# Patient Record
Sex: Male | Born: 1990 | Race: Black or African American | Hispanic: No | Marital: Single | Smoking: Current every day smoker
Health system: Southern US, Community
[De-identification: ages and names within clinical notes are randomized; demographics above are authoritative.]

## PROBLEM LIST (undated history)

## (undated) DIAGNOSIS — R079 Chest pain, unspecified: Secondary | ICD-10-CM

## (undated) DIAGNOSIS — R012 Other cardiac sounds: Secondary | ICD-10-CM

## (undated) DIAGNOSIS — Z720 Tobacco use: Secondary | ICD-10-CM

## (undated) HISTORY — DX: Chest pain, unspecified: R07.9

## (undated) HISTORY — DX: Tobacco use: Z72.0

## (undated) HISTORY — DX: Other cardiac sounds: R01.2

---

## 2012-12-30 ENCOUNTER — Encounter (HOSPITAL_COMMUNITY): Payer: Self-pay | Admitting: *Deleted

## 2012-12-30 ENCOUNTER — Emergency Department (HOSPITAL_COMMUNITY)
Admission: EM | Admit: 2012-12-30 | Discharge: 2012-12-30 | Disposition: A | Discharge status: Home / Self Care | Payer: PRIVATE HEALTH INSURANCE | Attending: Emergency Medicine | Admitting: Emergency Medicine

## 2012-12-30 DIAGNOSIS — IMO0002 Reserved for concepts with insufficient information to code with codable children: Secondary | ICD-10-CM

## 2012-12-30 DIAGNOSIS — Y9372 Activity, wrestling: Secondary | ICD-10-CM | POA: Insufficient documentation

## 2012-12-30 DIAGNOSIS — Z79899 Other long term (current) drug therapy: Secondary | ICD-10-CM | POA: Insufficient documentation

## 2012-12-30 DIAGNOSIS — W269XXA Contact with unspecified sharp object(s), initial encounter: Secondary | ICD-10-CM | POA: Insufficient documentation

## 2012-12-30 DIAGNOSIS — S61209A Unspecified open wound of unspecified finger without damage to nail, initial encounter: Secondary | ICD-10-CM | POA: Insufficient documentation

## 2012-12-30 DIAGNOSIS — F17200 Nicotine dependence, unspecified, uncomplicated: Secondary | ICD-10-CM | POA: Insufficient documentation

## 2012-12-30 DIAGNOSIS — Y929 Unspecified place or not applicable: Secondary | ICD-10-CM | POA: Insufficient documentation

## 2012-12-30 MED ORDER — CEPHALEXIN 250 MG PO CAPS
500.0000 mg | ORAL_CAPSULE | Freq: Once | ORAL | Status: AC
  Administered 2012-12-30: 500 mg via ORAL
  Filled 2012-12-30: qty 2

## 2012-12-30 MED ORDER — CEPHALEXIN 500 MG PO CAPS: 500.0000 mg | ORAL_CAPSULE | Freq: Four times a day (QID) | ORAL | Status: DC

## 2012-12-30 NOTE — ED Notes (Signed)
Pt c/o pain to left ring finger that radiates down elbow. Pt reports he was wrestling with his friend last night when he cut his finger, pt sts he is unsure what actually cut it. No bleeding noted. Pt in nad. Pt has full movement of all digits.

## 2012-12-30 NOTE — Discharge Instructions (Signed)
Is very important that you take your antibiotics as directed and return to the emergency room in 2 days for a recheck. Return to the emergency room immediately if you see signs of infection( warmth, redness, tenderness, pus, sharp increase in pain, fever) .   Keep wound dry and do not remove dressing for 24 hours, after that wash gently morning and night with soap and water. Do NOT use rubbing alcohol or hydrogen peroxide, you may use a topical antibiotic ointment and cover with a bandaid.

## 2012-12-30 NOTE — ED Provider Notes (Signed)
History  This chart was scribed for non-physician practitioner, Wynetta Emery, PA-C working with Loren Racer, MD by Shari Heritage, ED Scribe. This patient was seen in room TR07C/TR07C and the patient's care was started at 1458.   CSN: [id number]  Arrival date & time 12/30/12  1420   First MD Initiated Contact with Patient 12/30/12 1458      Chief Complaint  Patient presents with  . Laceration    The history is provided by the patient. No language interpreter was used.    HPI Comments: Nathan Petersen is a 22 y.o. male who presents to the Emergency Department complaining of a laceration to the 4th left finger and mild to moderate, constant, dull pain to the area onset 17.5 hours ago. Patient states that he was wrestling with one of his friends when the incident occurred. He states that he is unsure what caused the laceration or through what mechanism. He states Tdap is UTD, last shot was in 2010. He reports no other significant past medical or surgical history. He is current every day smoker.    History reviewed. No pertinent past medical history.  History reviewed. No pertinent past surgical history.  History reviewed. No pertinent family history.  History  Substance Use Topics  . Smoking status: Current Every Day Smoker    Types: Cigarettes  . Smokeless tobacco: Not on file  . Alcohol Use: Yes     Comment: occ      Review of Systems  Constitutional: Negative for fever.  Respiratory: Negative for shortness of breath.   Cardiovascular: Negative for chest pain.  Gastrointestinal: Negative for nausea, vomiting, abdominal pain and diarrhea.  Skin: Positive for wound.  All other systems reviewed and are negative.    Allergies  Review of patient's allergies indicates no known allergies.  Home Medications   Current Outpatient Rx  Name  Route  Sig  Dispense  Refill  . naproxen sodium (ANAPROX) 220 MG tablet   Oral   Take 440 mg by mouth once.           Triage  Vitals: BP 115/74  Pulse 99  Temp(Src) 98.5 F (36.9 C) (Oral)  Resp 16  SpO2 97%  Physical Exam  Nursing note and vitals reviewed. Constitutional: He is oriented to person, place, and time. He appears well-developed and well-nourished. No distress.  HENT:  Head: Normocephalic.  Eyes: Conjunctivae and EOM are normal.  Cardiovascular: Normal rate.   Pulmonary/Chest: Effort normal. No stridor.  Musculoskeletal: Normal range of motion.  Neurological: He is alert and oriented to person, place, and time.  Skin: Laceration noted.  Full thickness, gaping laceration to the left 4th digit distal phalanx on the lateral side. Patient has full range of motion in both flexion and extension of the PIP and the PIP in isolation.  Psychiatric: He has a normal mood and affect.    ED Course  Procedures (including critical care time) DIAGNOSTIC STUDIES: Oxygen Saturation is 97% on room air, adequate by my interpretation.    COORDINATION OF CARE: 3:31 PM- Patient informed of current plan for treatment and evaluation and agrees with plan at this time.   4:47PM- Placed sutures to left 4th digit. Patient's lac occurred more than 17 hours ago so will perform loosely approximated closure.  LACERATION REPAIR Performed by: Wynetta Emery, PA-C Consent: Verbal consent obtained. Risks and benefits: risks, benefits and alternatives were discussed Patient identity confirmed: provided demographic data Time out performed prior to procedure Prepped and Draped in normal  sterile fashion Wound explored explored to depth in good light on a bloodless field there is no bony exposure or tendon exposure. Laceration Location: left 4th digit Laceration Length: 2.5 cm No Foreign Bodies seen or palpated Anesthesia: digital block Local anesthetic: lidocaine 2% wo epinephrine Anesthetic total: 3 ml Irrigation method: syringe Amount of cleaning: standard Skin closure: LOOSELY APPROXIMATED Number of sutures or  staples: 2  Technique: Simple interrupted  Patient tolerance: Patient tolerated the procedure well with no immediate complications.   Labs Reviewed - No data to display No results found.   1. Laceration       MDM   Wound was left open to close by secondary intention as he has waited too long to present to the emergency room. I have profusely irrigated and cleaned the wound including irrigation for 5 minutes and soaking in povidone, hydroperoxide and saline bath for 5 minutes. Will advise him to return to the emergency room for recheck in 48 hours.  Filed Vitals:   12/30/12 1436  BP: 115/74  Pulse: 99  Temp: 98.5 F (36.9 C)  TempSrc: Oral  Resp: 16  SpO2: 97%     Pt verbalized understanding and agrees with care plan. Outpatient follow-up and return precautions given.    Discharge Medication List as of 12/30/2012  5:28 PM    START taking these medications   Details  cephALEXin (KEFLEX) 500 MG capsule Take 1 capsule (500 mg total) by mouth 4 (four) times daily., Starting 12/30/2012, Until Discontinued, Print        I personally performed the services described in this documentation, which was scribed in my presence. The recorded information has been reviewed and is accurate.   Wynetta Emery, PA-C 12/31/12 1724

## 2012-12-30 NOTE — ED Notes (Signed)
Pt reports getting laceration to left ring finger, happened last night, does not remembered what happened, no bleeding noted at triage. Last tetanus 2010.

## 2013-01-01 NOTE — ED Provider Notes (Signed)
Medical screening examination/treatment/procedure(s) were performed by non-physician practitioner and as supervising physician I was immediately available for consultation/collaboration.  Darrall Strey, MD 01/01/13 1534 

## 2013-01-02 ENCOUNTER — Emergency Department (HOSPITAL_COMMUNITY)
Admission: EM | Admit: 2013-01-02 | Discharge: 2013-01-02 | Disposition: A | Discharge status: Home / Self Care | Payer: PRIVATE HEALTH INSURANCE | Attending: Emergency Medicine | Admitting: Emergency Medicine

## 2013-01-02 ENCOUNTER — Encounter (HOSPITAL_COMMUNITY): Payer: Self-pay | Admitting: *Deleted

## 2013-01-02 DIAGNOSIS — F17200 Nicotine dependence, unspecified, uncomplicated: Secondary | ICD-10-CM | POA: Insufficient documentation

## 2013-01-02 DIAGNOSIS — IMO0002 Reserved for concepts with insufficient information to code with codable children: Secondary | ICD-10-CM

## 2013-01-02 DIAGNOSIS — Z4802 Encounter for removal of sutures: Secondary | ICD-10-CM | POA: Insufficient documentation

## 2013-01-02 NOTE — ED Provider Notes (Signed)
History  This chart was scribed for Nathan Petersen, non-physician practitioner working with Nathan Petersen,  by [name], ED Scribe. This patient was seen in room TR09C/TR09C and the patient's care was started at 4:58 PM.  CSN: [id number]  Arrival date & time 01/02/13  1636   First MD Initiated Contact with Patient 01/02/13 1658      Chief Complaint  Patient presents with  . Suture / Staple Removal     Patient is a 22 y.o. male presenting with wound check. The history is provided by the patient. No language interpreter was used.  Wound Check This is a new problem. The current episode started 2 days ago. The problem occurs constantly. The problem has been gradually improving. Exacerbated by: use of the left fourth finger. Nothing relieves the symptoms.    Nathan Petersen is a 22 y.o. male who presents to the Emergency Department for a wound recheck. He was seen in the ED 2 days ago and had sutures placed to the left fourth finger. He reports that the injury occurred the day before but he was "scared to come in". He states that he has had the wound wrapped with sterile bandages and has been washing the area with soap and water for the past 2 days. He was prescribed keflex and states that he has been taking the antibiotic as prescribed. He denies any serosanguinous drainage or increased pain to the area. He does not have a h/o chronic medical conditions and is a current everyday smoker and occasional alcohol user.   History reviewed. No pertinent past medical history.  History reviewed. No pertinent past surgical history.  No family history on file.  History  Substance Use Topics  . Smoking status: Current Every Day Smoker    Types: Cigarettes  . Smokeless tobacco: Not on file  . Alcohol Use: Yes     Comment: occ      Review of Systems  Constitutional: Negative for fever and chills.  Gastrointestinal: Negative for nausea and vomiting.  Skin: Positive for wound (to  the left fourth finger).  All other systems reviewed and are negative.    Allergies  Review of patient's allergies indicates no known allergies.  Home Medications   Current Outpatient Rx  Name  Route  Sig  Dispense  Refill  . cephALEXin (KEFLEX) 500 MG capsule   Oral   Take 1 capsule (500 mg total) by mouth 4 (four) times daily.   20 capsule   0   . naproxen sodium (ANAPROX) 220 MG tablet   Oral   Take 440 mg by mouth once.           Triage Vitals: BP 109/89  Pulse 50  Temp(Src) 98.2 F (36.8 C) (Oral)  SpO2 99%  Physical Exam  Nursing note and vitals reviewed. Constitutional: He is oriented to person, place, and time. He appears well-developed and well-nourished. No distress.  HENT:  Head: Normocephalic and atraumatic.  Eyes: EOM are normal.  Neck: Neck supple. No tracheal deviation present.  Cardiovascular: Normal rate.   Pulmonary/Chest: Effort normal. No respiratory distress.  Musculoskeletal: Normal range of motion.  Neurological: He is alert and oriented to person, place, and time.  Skin: Skin is warm and dry.  Well-healing delayed closure sutured laceration to the left fourth finger, no surrounding erythema or drainage  Psychiatric: He has a normal mood and affect. His behavior is normal.    ED Course  Procedures (including critical care time)  DIAGNOSTIC STUDIES:  Oxygen Saturation is 99% on room air, normal by my interpretation.    COORDINATION OF CARE: 5:13 PM- Discussed discharge plan which includes continued cleaning of the wound and returning for suture removal in 5-7 days with pt and pt agreed to plan.    Labs Reviewed - No data to display No results found.   No diagnosis found.    MDM     I personally performed the services described in this documentation, which was scribed in my presence. The recorded information has been reviewed and is accurate.      [name], NP 01/02/13 [phone]

## 2013-01-02 NOTE — ED Notes (Signed)
Pt here for suture removal.  Denies s/s of infection.

## 2013-01-03 NOTE — ED Provider Notes (Signed)
Medical screening examination/treatment/procedure(s) were performed by non-physician practitioner and as supervising physician I was immediately available for consultation/collaboration.  Balin Vandegrift J. Tkeya Stencil, MD 01/03/13 0006 

## 2016-01-17 ENCOUNTER — Encounter (HOSPITAL_COMMUNITY): Payer: Self-pay | Admitting: Emergency Medicine

## 2016-01-17 ENCOUNTER — Emergency Department (HOSPITAL_COMMUNITY)
Admission: EM | Admit: 2016-01-17 | Discharge: 2016-01-17 | Disposition: A | Discharge status: Home / Self Care | Payer: Self-pay | Attending: Emergency Medicine | Admitting: Emergency Medicine

## 2016-01-17 ENCOUNTER — Emergency Department (HOSPITAL_COMMUNITY): Payer: Self-pay

## 2016-01-17 DIAGNOSIS — R0789 Other chest pain: Secondary | ICD-10-CM

## 2016-01-17 DIAGNOSIS — R05 Cough: Secondary | ICD-10-CM | POA: Insufficient documentation

## 2016-01-17 DIAGNOSIS — Z792 Long term (current) use of antibiotics: Secondary | ICD-10-CM | POA: Insufficient documentation

## 2016-01-17 DIAGNOSIS — I4510 Unspecified right bundle-branch block: Secondary | ICD-10-CM | POA: Insufficient documentation

## 2016-01-17 DIAGNOSIS — F17210 Nicotine dependence, cigarettes, uncomplicated: Secondary | ICD-10-CM | POA: Insufficient documentation

## 2016-01-17 LAB — BASIC METABOLIC PANEL
Anion gap: 11 (ref 5–15)
BUN: 13 mg/dL (ref 6–20)
CALCIUM: 9.5 mg/dL (ref 8.9–10.3)
CO2: 30 mmol/L (ref 22–32)
Chloride: 103 mmol/L (ref 101–111)
Creatinine, Ser: 1.02 mg/dL (ref 0.61–1.24)
GFR calc Af Amer: >60 mL/min (ref >60)
GLUCOSE: 85 mg/dL (ref 65–99)
POTASSIUM: 3.7 mmol/L (ref 3.5–5.1)
Sodium: 144 mmol/L (ref 135–145)

## 2016-01-17 LAB — CBC
HEMATOCRIT: 46.1 % (ref 39.0–52.0)
Hemoglobin: 15.4 g/dL (ref 13.0–17.0)
MCH: 29.6 pg (ref 26.0–34.0)
MCHC: 33.4 g/dL (ref 30.0–36.0)
MCV: 88.5 fL (ref 78.0–100.0)
PLATELETS: 195 10*3/uL (ref 150–400)
RBC: 5.21 MIL/uL (ref 4.22–5.81)
RDW: 13 % (ref 11.5–15.5)
WBC: 6 10*3/uL (ref 4.0–10.5)

## 2016-01-17 LAB — I-STAT TROPONIN, ED: TROPONIN I, POC: 0 ng/mL (ref 0.00–0.08)

## 2016-01-17 LAB — D-DIMER, QUANTITATIVE: D-Dimer, Quant: <0.27 ug/mL-FEU (ref 0.00–0.50)

## 2016-01-17 NOTE — ED Notes (Signed)
Lab to add on d-dimer 

## 2016-01-17 NOTE — Discharge Instructions (Signed)
Nonspecific Chest Pain  °Chest pain can be caused by many different conditions. There is always a chance that your pain could be related to something serious, such as a heart attack or a blood clot in your lungs. Chest pain can also be caused by conditions that are not life-threatening. If you have chest pain, it is very important to follow up with your health care provider. °CAUSES  °Chest pain can be caused by: °· Heartburn. °· Pneumonia or bronchitis. °· Anxiety or stress. °· Inflammation around your heart (pericarditis) or lung (pleuritis or pleurisy). °· A blood clot in your lung. °· A collapsed lung (pneumothorax). It can develop suddenly on its own (spontaneous pneumothorax) or from trauma to the chest. °· Shingles infection (varicella-zoster virus). °· Heart attack. °· Damage to the bones, muscles, and cartilage that make up your chest wall. This can include: °¨ Bruised bones due to injury. °¨ Strained muscles or cartilage due to frequent or repeated coughing or overwork. °¨ Fracture to one or more ribs. °¨ Sore cartilage due to inflammation (costochondritis). °RISK FACTORS  °Risk factors for chest pain may include: °· Activities that increase your risk for trauma or injury to your chest. °· Respiratory infections or conditions that cause frequent coughing. °· Medical conditions or overeating that can cause heartburn. °· Heart disease or family history of heart disease. °· Conditions or health behaviors that increase your risk of developing a blood clot. °· Having had chicken pox (varicella zoster). °SIGNS AND SYMPTOMS °Chest pain can feel like: °· Burning or tingling on the surface of your chest or deep in your chest. °· Crushing, pressure, aching, or squeezing pain. °· Dull or sharp pain that is worse when you move, cough, or take a deep breath. °· Pain that is also felt in your back, neck, shoulder, or arm, or pain that spreads to any of these areas. °Your chest pain may come and go, or it may stay  constant. °DIAGNOSIS °Lab tests or other studies may be needed to find the cause of your pain. Your health care provider may have you take a test called an ambulatory ECG (electrocardiogram). An ECG records your heartbeat patterns at the time the test is performed. You may also have other tests, such as: °· Transthoracic echocardiogram (TTE). During echocardiography, sound waves are used to create a picture of all of the heart structures and to look at how blood flows through your heart. °· Transesophageal echocardiogram (TEE). This is a more advanced imaging test that obtains images from inside your body. It allows your health care provider to see your heart in finer detail. °· Cardiac monitoring. This allows your health care provider to monitor your heart rate and rhythm in real time. °· Holter monitor. This is a portable device that records your heartbeat and can help to diagnose abnormal heartbeats. It allows your health care provider to track your heart activity for several days, if needed. °· Stress tests. These can be done through exercise or by taking medicine that makes your heart beat more quickly. °· Blood tests. °· Imaging tests. °TREATMENT  °Your treatment depends on what is causing your chest pain. Treatment may include: °· Medicines. These may include: °¨ Acid blockers for heartburn. °¨ Anti-inflammatory medicine. °¨ Pain medicine for inflammatory conditions. °¨ Antibiotic medicine, if an infection is present. °¨ Medicines to dissolve blood clots. °¨ Medicines to treat coronary artery disease. °· Supportive care for conditions that do not require medicines. This may include: °¨ Resting. °¨ Applying heat   or cold packs to injured areas. °¨ Limiting activities until pain decreases. °HOME CARE INSTRUCTIONS °· If you were prescribed an antibiotic medicine, finish it all even if you start to feel better. °· Avoid any activities that bring on chest pain. °· Do not use any tobacco products, including  cigarettes, chewing tobacco, or electronic cigarettes. If you need help quitting, ask your health care provider. °· Do not drink alcohol. °· Take medicines only as directed by your health care provider. °· Keep all follow-up visits as directed by your health care provider. This is important. This includes any further testing if your chest pain does not go away. °· If heartburn is the cause for your chest pain, you may be told to keep your head raised (elevated) while sleeping. This reduces the chance that acid will go from your stomach into your esophagus. °· Make lifestyle changes as directed by your health care provider. These may include: °¨ Getting regular exercise. Ask your health care provider to suggest some activities that are safe for you. °¨ Eating a heart-healthy diet. A registered dietitian can help you to learn healthy eating options. °¨ Maintaining a healthy weight. °¨ Managing diabetes, if necessary. °¨ Reducing stress. °SEEK MEDICAL CARE IF: °· Your chest pain does not go away after treatment. °· You have a rash with blisters on your chest. °· You have a fever. °SEEK IMMEDIATE MEDICAL CARE IF:  °· Your chest pain is worse. °· You have an increasing cough, or you cough up blood. °· You have severe abdominal pain. °· You have severe weakness. °· You faint. °· You have chills. °· You have sudden, unexplained chest discomfort. °· You have sudden, unexplained discomfort in your arms, back, neck, or jaw. °· You have shortness of breath at any time. °· You suddenly start to sweat, or your skin gets clammy. °· You feel nauseous or you vomit. °· You suddenly feel light-headed or dizzy. °· Your heart begins to beat quickly, or it feels like it is skipping beats. °These symptoms may represent a serious problem that is an emergency. Do not wait to see if the symptoms will go away. Get medical help right away. Call your local emergency services (911 in the [place] Do not drive yourself to the hospital. °  °This  information is not intended to replace advice given to you by your health care provider. Make sure you discuss any questions you have with your health care provider. °  °Document Released: 08/18/2005 Document Revised: 11/29/2014 Document Reviewed: 06/14/2014 °Elsevier Interactive Patient Education ©2016 [employer] ° °

## 2016-01-17 NOTE — ED Notes (Signed)
Pt stable, ambulatory, states understanding of discharge instructions 

## 2016-01-17 NOTE — ED Provider Notes (Signed)
CSN: [id number]     Arrival date & time 01/17/16  1648 History   First MD Initiated Contact with Patient 01/17/16 1808     Chief Complaint  Patient presents with  . Chest Pain     (Consider location/radiation/quality/duration/timing/severity/associated sxs/prior Treatment) Patient is a 25 y.o. male presenting with chest pain.  Chest Pain Pain location:  Substernal area Pain quality: sharp and shooting   Radiates to: right chest. Pain radiates to the back: no   Pain severity:  Moderate Onset quality:  Sudden Duration: <44min. Timing:  Sporadic Progression:  Waxing and waning Chronicity:  New Relieved by:  Rest Worsened by:  Nothing tried (self resolved) Associated symptoms: cough and palpitations   Associated symptoms: no abdominal pain, no anxiety, no back pain, no claudication, no dizziness, no fatigue, no fever, no headache, no lower extremity edema, no nausea, no near-syncope, no orthopnea, no shortness of breath, not vomiting and no weakness   Risk factors: male sex and smoking   Risk factors: no coronary artery disease, no diabetes mellitus, no high cholesterol, no hypertension and no prior DVT/PE     History reviewed. No pertinent past medical history. History reviewed. No pertinent past surgical history. No family history on file. Social History  Substance Use Topics  . Smoking status: Current Every Day Smoker    Types: Cigarettes  . Smokeless tobacco: None  . Alcohol Use: Yes     Comment: occ    Review of Systems  Constitutional: Negative for fever, chills, appetite change and fatigue.  HENT: Negative for congestion, ear pain, facial swelling, mouth sores and sore throat.   Eyes: Negative for visual disturbance.  Respiratory: Positive for cough. Negative for chest tightness and shortness of breath.   Cardiovascular: Positive for chest pain and palpitations. Negative for orthopnea, claudication and near-syncope.  Gastrointestinal: Negative for nausea, vomiting,  abdominal pain, diarrhea and blood in stool.  Endocrine: Negative for cold intolerance and heat intolerance.  Genitourinary: Negative for frequency, decreased urine volume and difficulty urinating.  Musculoskeletal: Negative for back pain and neck stiffness.  Skin: Negative for rash.  Neurological: Negative for dizziness, weakness, light-headedness and headaches.  All other systems reviewed and are negative.     Allergies  Review of patient's allergies indicates no known allergies.  Home Medications   Prior to Admission medications   Medication Sig Start Date End Date Taking? Authorizing Provider  cephALEXin (KEFLEX) 500 MG capsule Take 1 capsule (500 mg total) by mouth 4 (four) times daily. 12/30/12   Nicole Pisciotta, PA-C   BP 125/85 mmHg  Pulse 52  Temp(Src) 97.9 F (36.6 C) (Oral)  Resp 12  Ht  (1.778 m)  SpO2 100% Physical Exam  Constitutional: He is oriented to person, place, and time. He appears well-nourished. No distress.  HENT:  Head: Normocephalic and atraumatic.  Right Ear: External ear normal.  Left Ear: External ear normal.  Eyes: Pupils are equal, round, and reactive to light. Right eye exhibits no discharge. Left eye exhibits no discharge. No scleral icterus.  Neck: Normal range of motion. Neck supple.  Cardiovascular: Normal rate.  Exam reveals no gallop and no friction rub.   No murmur heard. Pulmonary/Chest: Effort normal and breath sounds normal. No stridor. No respiratory distress. He has no wheezes. He has no rales. He exhibits no tenderness.  Abdominal: Soft. He exhibits no distension and no mass. There is no tenderness. There is no rebound and no guarding.  Musculoskeletal: He exhibits no edema or tenderness.  Neurological: He is alert and oriented to person, place, and time.  Skin: Skin is warm and dry. No rash noted. He is not diaphoretic. No erythema.    ED Course  Procedures (including critical care time) Labs Review Labs Reviewed   BASIC METABOLIC PANEL  CBC  D-DIMER, QUANTITATIVE (NOT AT [facility])  Rosezena Sensor, ED    Imaging Review Dg Chest 2 View  01/17/2016  CLINICAL DATA:  Bilateral upper chest pain and tightness for the past 2 weeks EXAM: CHEST  2 VIEW COMPARISON:  None. FINDINGS: The heart size and mediastinal contours are within normal limits. Both lungs are clear. The visualized skeletal structures are unremarkable. IMPRESSION: No active cardiopulmonary disease. Electronically Signed   By: Charlett Nose M.D.   On: 01/17/2016 17:50   I have personally reviewed and evaluated these images and lab results as part of my medical decision-making.   EKG Interpretation   Date/Time:  Saturday January 17 2016 16:54:33 EST Ventricular Rate:  72 PR Interval:  164 QRS Duration: 146 QT Interval:  392 QTC Calculation: 429 R Axis:   106 Text Interpretation:  Normal sinus rhythm Right bundle branch block  Abnormal ECG No old tracing to compare Confirmed by NANAVATI, MD, Janey Genta  [phone]) on 01/17/2016 7:20:43 PM      MDM   2 weeks of intermittent chest pain. Rest of the history as above. No family history of sudden cardiac death. EKG with right bundle branch block without evidence of acute ischemia or arrhythmias. Labs unremarkable. Troponin negative. D-dimer obtained to rule out possible PE given the right bundle branch block even though the patient was PERC negative. D-dimer negative. Chest x-ray without evidence of pneumonia, pneumothorax, pneumomediastinum, pleural effusions, pulmonary edema. Mediastinum not concerning for dissection. Presentation is concerning for ACS. No emergent etiologies identified during workup. Given the patient's right bundle branch block we did discuss with him the need for cardiology follow-up. He was provided with information for the cardiologist to establish care and have continued workup of his right bundle branch block.  Diagnostic studies interpreted by me and use to my clinical  decision-making. Next  Patient was seen in conjunction with Dr. Rhunette Croft   Final diagnoses:  Other chest pain  RBBB       Drema Pry, MD 01/17/16 2113  Derwood Kaplan, MD 01/18/16 6045

## 2016-01-17 NOTE — ED Notes (Addendum)
Pt c/o intermittent sharp chest pain x 2 weeks. Pt denies any other symptoms. Pt reports that he drinks 2 energy drinks within 10 mins.

## 2016-01-19 ENCOUNTER — Telehealth (HOSPITAL_BASED_OUTPATIENT_CLINIC_OR_DEPARTMENT_OTHER): Payer: Self-pay | Admitting: Emergency Medicine

## 2016-01-22 ENCOUNTER — Ambulatory Visit (INDEPENDENT_AMBULATORY_CARE_PROVIDER_SITE_OTHER): Payer: PRIVATE HEALTH INSURANCE | Admitting: Cardiovascular Disease

## 2016-01-22 ENCOUNTER — Encounter: Payer: Self-pay | Admitting: Cardiovascular Disease

## 2016-01-22 VITALS — BP 102/80 | HR 50 | Ht 70.0 in | Wt 149.5 lb

## 2016-01-22 DIAGNOSIS — Z13220 Encounter for screening for lipoid disorders: Secondary | ICD-10-CM | POA: Diagnosis not present

## 2016-01-22 DIAGNOSIS — R011 Cardiac murmur, unspecified: Secondary | ICD-10-CM | POA: Diagnosis not present

## 2016-01-22 DIAGNOSIS — Z720 Tobacco use: Secondary | ICD-10-CM | POA: Diagnosis not present

## 2016-01-22 DIAGNOSIS — R079 Chest pain, unspecified: Secondary | ICD-10-CM

## 2016-01-22 DIAGNOSIS — R072 Precordial pain: Secondary | ICD-10-CM

## 2016-01-22 DIAGNOSIS — R012 Other cardiac sounds: Secondary | ICD-10-CM | POA: Insufficient documentation

## 2016-01-22 HISTORY — DX: Other cardiac sounds: R01.2

## 2016-01-22 HISTORY — DX: Tobacco use: Z72.0

## 2016-01-22 HISTORY — DX: Chest pain, unspecified: R07.9

## 2016-01-22 NOTE — Progress Notes (Signed)
Cardiology Office Note   Date:  01/22/2016   ID:  Nathan Petersen, DOB 1991-04-06, MRN [id number]  PCP:  Default, Provider, MD  Cardiologist:   Madilyn Hook, MD   Chief Complaint  Patient presents with  . New Patient (Initial Visit)    not currently chest pain, no shortness of breath, has edema-with extensive standing, no pain or cramping in legs, no lightheadedness or dizziness, fatigue      History of Present Illness: Nathan Petersen is a 25 y.o. male who presents for follow up on chest pain.  Nathan Petersen was seen in the ED on 2/25 with chest pain.  EKG showed RBBB.  Cardiac enzymes were negative, as was the d-dimer.  Given the RBBB, he was instructed to follow up with cardiology for evaluation.  Nathan Petersen has been noticing chest pain for two to three weeks.  It usually occurs after smoking Black and mild or when he lays down.  It lasts for 1-2 minutes and is 6/10 in severity.  It feels like tightness and pressure and does not radiate.  There is no associated shortness of breath, nausea, vomiting or diaphoresis.  The episodes occur several times per week.  Since this started he has stopped smoking.  He does occasionally smoke marijuana.  Nathan Petersen reports several family members with premature CAD.  His aunt had an MI at age 48, maternal grandmother at 68 and maternal grandfather at 91.     Past Medical History  Diagnosis Date  . Tobacco abuse 01/22/2016  . Chest pain 01/22/2016  . Abnormal heart sounds 01/22/2016    S3    No past surgical history on file.   No current outpatient prescriptions on file.   No current facility-administered medications for this visit.    Allergies:   Review of patient's allergies indicates no known allergies.    Social History:  The patient  reports that he has been smoking Cigarettes.  He has never used smokeless tobacco. He reports that he drinks alcohol. He reports that he does not use illicit drugs.   Family History:  The patient's  family history includes CAD in his maternal grandfather, maternal grandmother, and mother.    ROS:  Please see the history of present illness.   Otherwise, review of systems are positive for none.   All other systems are reviewed and negative.    PHYSICAL EXAM: VS:  BP 102/80 mmHg  Pulse 50  Ht  (1.778 m)  Wt 67.813 kg (149 lb 8 oz)  BMI 21.45 kg/m2 , BMI Body mass index is 21.45 kg/(m^2). GENERAL:  Well appearing HEENT:  Pupils equal round and reactive, fundi not visualized, oral mucosa unremarkable NECK:  No jugular venous distention, waveform within normal limits, carotid upstroke brisk and symmetric, no bruits, no thyromegaly LYMPHATICS:  No cervical adenopathy LUNGS:  Clear to auscultation bilaterally HEART:  RRR.  PMI not displaced or sustained,S1 and S2 within normal limits, +S3, no S4, no clicks, no rubs, no murmurs ABD:  Flat, positive bowel sounds normal in frequency in pitch, no bruits, no rebound, no guarding, no midline pulsatile mass, no hepatomegaly, no splenomegaly EXT:  2 plus pulses throughout, no edema, no cyanosis no clubbing SKIN:  No rashes no nodules NEURO:  Cranial nerves II through XII grossly intact, motor grossly intact throughout PSYCH:  Cognitively intact, oriented to person place and time    EKG:  EKG is ordered today. The ekg ordered today demonstrates sinus bradycardia.  Rate 50 bpm.  RBBB.     Recent Labs: 01/17/2016: BUN 13; Creatinine, Ser 1.02; Hemoglobin 15.4; Platelets 195; Potassium 3.7; Sodium 144    Lipid Panel No results found for: CHOL, TRIG, HDL, CHOLHDL, VLDL, LDLCALC, LDLDIRECT    Wt Readings from Last 3 Encounters:  01/22/16 67.813 kg (149 lb 8 oz)      ASSESSMENT AND PLAN:  # Chest pain: Nathan Petersen has atypical chest pain.   Given his age and the lack of exertional symptoms, it is unlikely that he has obstructive coronary disease.  Given his family history of premature CAD, we will obtain an exercise Cardiolite.  He  has a RBBB with repolarization abnormalities, so the sensitivity and specificity of a GXT without imaging would be poor.  We will also check a lipid panel to establish his baseline risk.  # S3: Nathan Petersen has an S3 on exam and a RBBB on echo with concern for LVH.  We will obtain an echo to evaluate for structural heart disease.  # Tobacco abuse: Nathan Petersen was encouraged to abstain from tobacco products.    Current medicines are reviewed at length with the patient today.  The patient does not have concerns regarding medicines.  The following changes have been made:  no change  Labs/ tests ordered today include:   Orders Placed This Encounter  Procedures  . Lipid panel  . Myocardial Perfusion Imaging  . EKG 12-Lead  . ECHOCARDIOGRAM COMPLETE     Disposition:   FU with Anasofia Micallef C. Duke Salvia, MD, [facility] as needed.   This note was written with the assistance of speech recognition software.  Please excuse any transcriptional errors.  Signed, Hailly Fess C. Duke Salvia, MD, [facility]  01/22/2016 1:01 PM    [facility]

## 2016-01-22 NOTE — Patient Instructions (Addendum)
Your physician has requested that you have an echocardiogram. Echocardiography is a painless test that uses sound waves to create images of your heart. It provides your doctor with information about the size and shape of your heart and how well your heart's chambers and valves are working. This procedure takes approximately one hour. There are no restrictions for this procedure.   Your physician has requested that you have en exercise stress myoview. For further information please visit [email or web address] Please follow instruction sheet, as given.  Your physician recommends that you schedule a follow-up appointment as needed. You will be notified of your tests results, or you may sign up for my chart.

## 2016-02-03 ENCOUNTER — Telehealth (HOSPITAL_COMMUNITY): Payer: Self-pay

## 2016-02-03 NOTE — Telephone Encounter (Signed)
Encounter complete. 

## 2016-02-04 ENCOUNTER — Telehealth (HOSPITAL_COMMUNITY): Payer: Self-pay

## 2016-02-04 NOTE — Telephone Encounter (Signed)
Encounter complete. 

## 2016-02-05 ENCOUNTER — Ambulatory Visit (HOSPITAL_COMMUNITY)
Admission: RE | Admit: 2016-02-05 | Discharge: 2016-02-05 | Disposition: A | Discharge status: Home / Self Care | Payer: Self-pay | Source: Ambulatory Visit | Attending: Cardiology | Admitting: Cardiology

## 2016-02-05 DIAGNOSIS — R079 Chest pain, unspecified: Secondary | ICD-10-CM | POA: Insufficient documentation

## 2016-02-05 DIAGNOSIS — R5383 Other fatigue: Secondary | ICD-10-CM | POA: Insufficient documentation

## 2016-02-05 DIAGNOSIS — I517 Cardiomegaly: Secondary | ICD-10-CM | POA: Insufficient documentation

## 2016-02-05 DIAGNOSIS — Z720 Tobacco use: Secondary | ICD-10-CM | POA: Insufficient documentation

## 2016-02-05 DIAGNOSIS — R42 Dizziness and giddiness: Secondary | ICD-10-CM | POA: Insufficient documentation

## 2016-02-05 DIAGNOSIS — I4510 Unspecified right bundle-branch block: Secondary | ICD-10-CM | POA: Insufficient documentation

## 2016-02-05 DIAGNOSIS — R011 Cardiac murmur, unspecified: Secondary | ICD-10-CM | POA: Insufficient documentation

## 2016-02-05 DIAGNOSIS — Z8249 Family history of ischemic heart disease and other diseases of the circulatory system: Secondary | ICD-10-CM | POA: Insufficient documentation

## 2016-02-05 MED ORDER — TECHNETIUM TC 99M SESTAMIBI GENERIC - CARDIOLITE
10.3000 | Freq: Once | INTRAVENOUS | Status: AC | PRN
  Administered 2016-02-05: 10.3 via INTRAVENOUS

## 2016-02-05 MED ORDER — TECHNETIUM TC 99M SESTAMIBI GENERIC - CARDIOLITE
29.3000 | Freq: Once | INTRAVENOUS | Status: AC | PRN
  Administered 2016-02-05: 29.3 via INTRAVENOUS

## 2016-02-06 LAB — MYOCARDIAL PERFUSION IMAGING
CHL CUP RESTING HR STRESS: 67 {beats}/min
CHL RATE OF PERCEIVED EXERTION: 16
CSEPED: 14 min
CSEPEW: 17.2 METS
CSEPPHR: 190 {beats}/min
LV dias vol: 144 mL (ref 62–150)
LVSYSVOL: 80 mL
MPHR: 197 {beats}/min
Percent HR: 96 %
SDS: 0
SRS: 0
SSS: 0
TID: 1.04

## 2016-02-10 ENCOUNTER — Other Ambulatory Visit: Payer: Self-pay

## 2016-02-10 ENCOUNTER — Ambulatory Visit (HOSPITAL_COMMUNITY): Payer: Self-pay | Attending: Cardiovascular Disease

## 2016-02-10 DIAGNOSIS — R079 Chest pain, unspecified: Secondary | ICD-10-CM | POA: Insufficient documentation

## 2016-02-10 DIAGNOSIS — R011 Cardiac murmur, unspecified: Secondary | ICD-10-CM | POA: Insufficient documentation

## 2016-02-13 ENCOUNTER — Telehealth: Payer: Self-pay | Admitting: *Deleted

## 2016-02-13 NOTE — Telephone Encounter (Signed)
-----   Message from Tiffany Lynchburg, MD sent at 02/10/2016  9:54 PM EDT ----- Normal echo. 

## 2016-02-13 NOTE — Telephone Encounter (Signed)
Left message to call back - in regards echo results

## 2016-02-17 ENCOUNTER — Telehealth: Payer: Self-pay | Admitting: *Deleted

## 2016-02-17 NOTE — Telephone Encounter (Signed)
Advised patient of echo results.

## 2016-02-17 NOTE — Telephone Encounter (Signed)
-----   Message from Chilton Siiffany , MD sent at 02/10/2016  9:54 PM EDT ----- Normal echo.

## 2016-02-23 NOTE — Progress Notes (Signed)
Cardiology Office Note   Date:  02/24/2016   ID:  Sallee Provencal, DOB [birth year], MRN [id number]  PCP:  No PCP Per Patient  Cardiologist:   Madilyn Hook, MD   Chief Complaint  Patient presents with  . Follow-up    denies any more chest pain. denies sob, le edema or claudication. here for test results     Patient ID: Nathan Petersen is a 25 y.o. male who presents for follow up on chest pain.  Interval History 02/24/16: After his last appointment, Nathan Petersen had an exercise Cardiolite that revealed LVEF 45% and no perfusion defects.  He then had an echo with normal systolic function.  He has been feeling well and has occasional episodes of chest pain, though not nearly as frequent as he had in the past.  He thinks the episode may be associated with stress. He's been working on trying to eliminate the stress from his life.  He says that he is "doing better" with smoking. However, he is unable to quantify how much is smoking now. He denies any lower extremity edema, orthopnea, or PND. He has not noted palpitations, lightheadedness or dizziness.  History of Present Illness 01/22/16:  Nathan Petersen was seen in the ED on 2/25 with chest pain.  EKG showed RBBB.  Cardiac enzymes were negative, as was the d-dimer.  Given the RBBB, he was instructed to follow up with cardiology for evaluation.  Nathan Petersen has been noticing chest pain for two to three weeks.  It usually occurs after smoking Black and mild or when he lays down.  It lasts for 1-2 minutes and is 6/10 in severity.  It feels like tightness and pressure and does not radiate.  There is no associated shortness of breath, nausea, vomiting or diaphoresis.  The episodes occur several times per week.  Since this started he has stopped smoking.  He does occasionally smoke marijuana.  Nathan Petersen reports several family members with premature CAD.  His aunt had an MI at age 67, maternal grandmother at 70 and maternal grandfather at 1.     Past  Medical History  Diagnosis Date  . Tobacco abuse 01/22/2016  . Chest pain 01/22/2016  . Abnormal heart sounds 01/22/2016    S3    No past surgical history on file.   No current outpatient prescriptions on file.   No current facility-administered medications for this visit.    Allergies:   Review of patient's allergies indicates no known allergies.    Social History:  The patient  reports that he has been smoking Cigarettes.  He has never used smokeless tobacco. He reports that he drinks alcohol. He reports that he does not use illicit drugs.   Family History:  The patient's family history includes CAD in his maternal grandfather, maternal grandmother, and mother.    ROS:  Please see the history of present illness.   Otherwise, review of systems are positive for none.   All other systems are reviewed and negative.    PHYSICAL EXAM: VS:  BP 120/60 mmHg  Pulse 80  Ht 5' 9.5" (1.765 m)  Wt 66.86 kg (147 lb 6.4 oz)  BMI 21.46 kg/m2 , BMI Body mass index is 21.46 kg/(m^2). GENERAL:  Well appearing HEENT:  Pupils equal round and reactive, fundi not visualized, oral mucosa unremarkable NECK:  No jugular venous distention, waveform within normal limits, carotid upstroke brisk and symmetric, no bruits, no thyromegaly LYMPHATICS:  No cervical adenopathy LUNGS:  Clear  to auscultation bilaterally HEART:  RRR.  PMI not displaced or sustained,S1 and S2 within normal limits, +S3, no S4, no clicks, no rubs, no murmurs ABD:  Flat, positive bowel sounds normal in frequency in pitch, no bruits, no rebound, no guarding, no midline pulsatile mass, no hepatomegaly, no splenomegaly EXT:  2 plus pulses throughout, no edema, no cyanosis no clubbing SKIN:  No rashes no nodules NEURO:  Cranial nerves II through XII grossly intact, motor grossly intact throughout PSYCH:  Cognitively intact, oriented to person place and time    EKG:  EKG is not ordered today.  Echo 02/10/16: Study Conclusions  - Left  ventricle: The cavity size was normal. Wall thickness was  normal. Systolic function was normal. The estimated ejection  fraction was in the range of 55% to 60%. - Mitral valve: There was systolic anterior motion.  Exercise Cardiolite 02/05/16:  The left ventricular ejection fraction is mildly decreased (45-54%).  Nuclear stress EF: 45%.  Blood pressure demonstrated a hypertensive response to exercise.  There was no ST segment deviation noted during stress.  This is a low risk study.    Recent Labs: 01/17/2016: BUN 13; Creatinine, Ser 1.02; Hemoglobin 15.4; Platelets 195; Potassium 3.7; Sodium 144    Lipid Panel No results found for: CHOL, TRIG, HDL, CHOLHDL, VLDL, LDLCALC, LDLDIRECT    Wt Readings from Last 3 Encounters:  02/24/16 66.86 kg (147 lb 6.4 oz)  02/05/16 67.586 kg (149 lb)  01/22/16 67.813 kg (149 lb 8 oz)      ASSESSMENT AND PLAN:  # Chest pain: Mr. Eldridge DaceStreeter has atypical chest pain that seemed to be more related to stress and anxiety than ischemia. He had normal stress testing which is reassuring.   # Tobacco abuse: Mr. Eldridge DaceStreeter was encouraged to abstain from tobacco products.   We discussed the fact that this is what is most likely to improve his longevity and decrease symptoms later in life. He expressed understanding and does not want any pharmacologic intervention at this time.  Current medicines are reviewed at length with the patient today.  The patient does not have concerns regarding medicines.  The following changes have been made:  no change  Labs/ tests ordered today include:   No orders of the defined types were placed in this encounter.     Disposition:   FU with Khandi Kernes C. Duke Salviaandolph, MD, [facility] as needed.   This note was written with the assistance of speech recognition software.  Please excuse any transcriptional errors.  Signed, Alroy Portela C. Duke Salviaandolph, MD, [facility]  02/24/2016 1:48 PM    [facility]

## 2016-02-24 ENCOUNTER — Encounter: Payer: Self-pay | Admitting: Cardiovascular Disease

## 2016-02-24 ENCOUNTER — Ambulatory Visit (INDEPENDENT_AMBULATORY_CARE_PROVIDER_SITE_OTHER): Payer: Self-pay | Admitting: Cardiovascular Disease

## 2016-02-24 VITALS — BP 120/60 | HR 80 | Ht 69.5 in | Wt 147.4 lb

## 2016-02-24 DIAGNOSIS — Z720 Tobacco use: Secondary | ICD-10-CM

## 2016-02-24 DIAGNOSIS — R072 Precordial pain: Secondary | ICD-10-CM

## 2016-02-24 NOTE — Patient Instructions (Signed)
Medication Instructions:  Your physician recommends that you continue on your current medications as directed. Please refer to the Current Medication list given to you today.  Labwork: none  Testing/Procedures: none  Follow-Up: As needed   If you need a refill on your cardiac medications before your next appointment, please call your pharmacy.  

## 2022-01-25 ENCOUNTER — Emergency Department (HOSPITAL_COMMUNITY): Payer: Self-pay

## 2022-01-25 ENCOUNTER — Other Ambulatory Visit: Payer: Self-pay

## 2022-01-25 ENCOUNTER — Emergency Department (HOSPITAL_COMMUNITY)
Admission: EM | Admit: 2022-01-25 | Discharge: 2022-01-25 | Disposition: A | Discharge status: Home / Self Care | Payer: Self-pay | Attending: Emergency Medicine | Admitting: Emergency Medicine

## 2022-01-25 DIAGNOSIS — S86012A Strain of left Achilles tendon, initial encounter: Secondary | ICD-10-CM | POA: Insufficient documentation

## 2022-01-25 DIAGNOSIS — X509XXA Other and unspecified overexertion or strenuous movements or postures, initial encounter: Secondary | ICD-10-CM | POA: Insufficient documentation

## 2022-01-25 DIAGNOSIS — Y9367 Activity, basketball: Secondary | ICD-10-CM | POA: Insufficient documentation

## 2022-01-25 NOTE — ED Provider Notes (Signed)
?[facility] ?Provider Note ? ? ?CSN: [id number] ?Arrival date & time: 01/25/22  0831 ? ?  ? ?History ? ?Chief Complaint  ?Patient presents with  ? Ankle Pain  ? ? ?Nathan Petersen is a 31 y.o. male. ? ?Pt reports he was playing basketball and had a tearing sensation in the back of his ankle  ? ?The history is provided by the patient. No language interpreter was used.  ?Ankle Pain ?Location:  Ankle ?Time since incident:  1 day ?Injury: yes   ?Ankle location:  L ankle ?Pain details:  ?  Quality:  Aching ?  Radiates to:  Does not radiate ?  Severity:  Moderate ?  Onset quality:  Gradual ?  Duration:  1 day ?  Timing:  Constant ?  Progression:  Worsening ?Chronicity:  New ?Dislocation: no   ?Foreign body present:  No foreign bodies ?Prior injury to area:  No ?Relieved by:  Nothing ?Worsened by:  Nothing ?Ineffective treatments:  None tried ?Associated symptoms: swelling   ?Associated symptoms: no tingling   ? ?  ? ?Home Medications ?Prior to Admission medications   ?Not on File  ?   ? ?Allergies    ?Patient has no known allergies.   ? ?Review of Systems   ?Review of Systems  ?All other systems reviewed and are negative. ? ?Physical Exam ?Updated Vital Signs ?BP (!) 131/95 (BP Location: Left Arm)   Pulse 75   Temp 98.1 ?F (36.7 ?C)   Resp 14   Ht 5\' 9"  (1.753 m)   Wt 68 kg   SpO2 100%   BMI 22.15 kg/m?  ?Physical Exam ?Vitals reviewed.  ?Constitutional:   ?   Appearance: Normal appearance.  ?Cardiovascular:  ?   Rate and Rhythm: Normal rate.  ?Musculoskeletal:     ?   General: Swelling and tenderness present.  ?   Comments: Tender right achilles area,  swollen,  nv and ns intact.  Pain with range of motion   ?Skin: ?   General: Skin is warm.  ?Neurological:  ?   General: No focal deficit present.  ?   Mental Status: He is alert.  ?Psychiatric:     ?   Mood and Affect: Mood normal.  ? ? ?ED Results / Procedures / Treatments   ?Labs ?(all labs ordered are listed, but only abnormal  results are displayed) ?Labs Reviewed - No data to display ? ?EKG ?None ? ?Radiology ?DG Ankle Complete Left ? ?Result Date: 01/25/2022 ?CLINICAL DATA:  Acute left ankle pain after basketball injury. EXAM: LEFT ANKLE COMPLETE - 3+ VIEW COMPARISON:  None. FINDINGS: There is no evidence of fracture, dislocation, or joint effusion. There is no evidence of arthropathy or other focal bone abnormality. Soft tissues are unremarkable. IMPRESSION: Negative. Electronically Signed   By: Titus Dubin M.D.   On: 01/25/2022 09:12  ? ?MR ANKLE LEFT WO CONTRAST ? ?Result Date: 01/25/2022 ?CLINICAL DATA:  Ankle trauma, dislocation/ligament injury suspected (Age >= 5y) ankle injury EXAM: MRI OF THE LEFT ANKLE WITHOUT CONTRAST TECHNIQUE: Multiplanar, multisequence MR imaging of the ankle was performed. No intravenous contrast was administered. COMPARISON:  X-ray 01/25/2022 FINDINGS: TENDONS Peroneal: Peroneus longus and brevis tendons are intact and normally positioned. Posteromedial: Tibialis posterior, flexor hallucis longus, and flexor digitorum longus tendons are intact and normally positioned. Anterior: Tibialis anterior, extensor hallucis longus, and extensor digitorum longus tendons are intact and normally positioned. Achilles: High-grade complete or near complete tear of the Achilles  tendon at the level of the myotendinous junction with a small amount of surrounding fluid and edema. No significant tendon retraction. Distal insertion of the Achilles tendon is intact. Plantar Fascia: Intact. LIGAMENTS Lateral: Intact tibiofibular ligaments. The anterior and posterior talofibular ligaments are intact. Intact calcaneofibular ligament. Medial: The deltoid and visualized portions of the spring ligament appear intact. CARTILAGE AND BONES Ankle Joint: No significant ankle joint effusion. The talar dome and tibial plafond are intact. Subtalar Joints/Sinus Tarsi: No cartilage defect. No effusion. Preservation of the anatomic fat within  the sinus tarsi. Bones: No acute fracture. No malalignment. No marrow signal abnormality. No suspicious bone lesion. Other: No significant soft tissue findings. IMPRESSION: High-grade complete or near-complete tear of the Achilles tendon at the level of the myotendinous junction. Electronically Signed   By: Davina Poke D.O.   On: 01/25/2022 14:32   ? ?Procedures ?Procedures  ? ? ?Medications Ordered in ED ?Medications - No data to display ? ?ED Course/ Medical Decision Making/ A&P ?  ?                        ?Medical Decision Making ?Pt has history consistent with  ? ?Amount and/or Complexity of Data Reviewed ?Radiology: ordered and independent interpretation performed. Decision-making details documented in ED Course. ?   Details: Xray of ankle is negative  ?MRi ordered reviewed and interpreted. Mri shows achilles tear. ?Discussion of management or test interpretation with external provider(s): I discussed the pt with Orion Crook PA on for Orthopaedics.  He advised Mri.  He advised to have pt call Dr. Doreatha Martin office tomorrow to be seen for evaltuion.  He advised boot and cruthes  ? ?Risk ?Risk Details: Pt declined medication for pain  ? ? ? ? ? ? ? ? ? ? ?Final Clinical Impression(s) / ED Diagnoses ?Final diagnoses:  ?Achilles rupture, left, initial encounter  ? ? ?Rx / DC Orders ?ED Discharge Orders   ? ? None  ? ?  ?An After Visit Summary was printed and given to the patient. ? ? ?  ?Fransico Meadow, [place] ?01/25/22 1521 ? ?  ?Davonna Belling, MD ?01/26/22 1229 ? ?

## 2022-01-25 NOTE — ED Triage Notes (Signed)
Pt here POV with c/o of ankle pain. Pt was playing basketball, jumped and when he landed heard a "pop".  Pt wrapped ankle and used icy hot patches without relief. Pulses intact. Pt unable to bare weight . 7/10 pain.  ?

## 2022-01-25 NOTE — ED Notes (Signed)
Patient transported to MRI 

## 2022-01-25 NOTE — Discharge Instructions (Signed)
Return if any problems.

## 2022-08-08 IMAGING — MR MR ANKLE*L* W/O CM
4 of 6 series · 28 of 40 positions shown · non-contrast
Comparison: X-ray 01/25/2022

CLINICAL DATA: Ankle trauma, dislocation/ligament injury suspected
(Age >= 5y) ankle injury

EXAM:
MRI OF THE LEFT ANKLE WITHOUT CONTRAST
TECHNIQUE: Multiplanar, multisequence MR imaging of the ankle was performed. No
intravenous contrast was administered.

[Series 2: PD fat-sat · axial · 3.0mm · 0.31mm/px · z∈[-75,+81]mm · 9 of 40 slices shown]
[im 1/40]
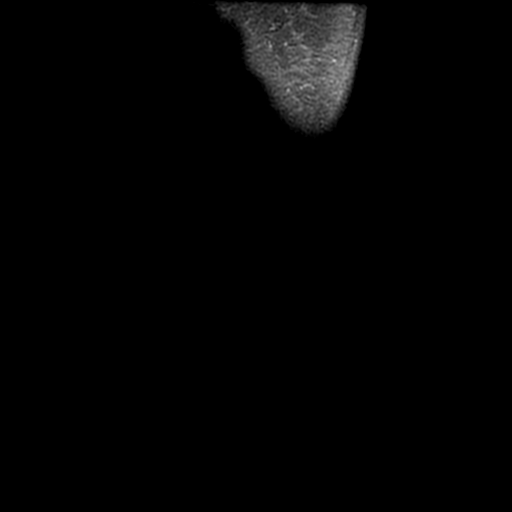
[im 5/40]
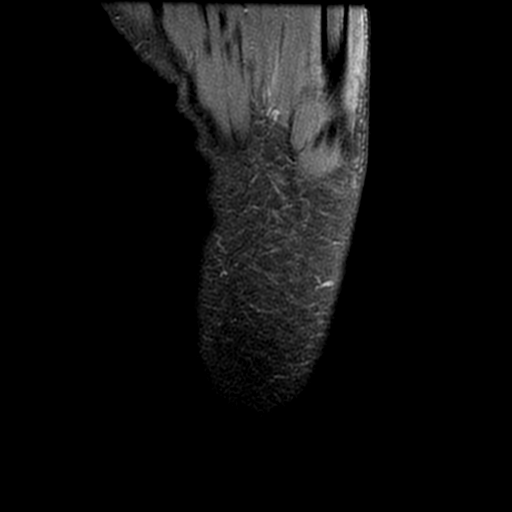
[im 10/40]
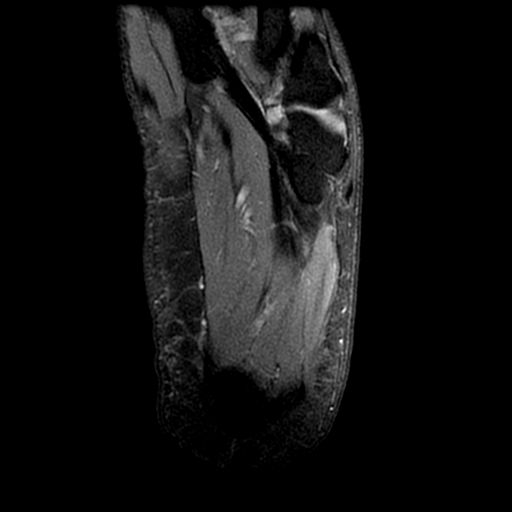
[im 15/40]
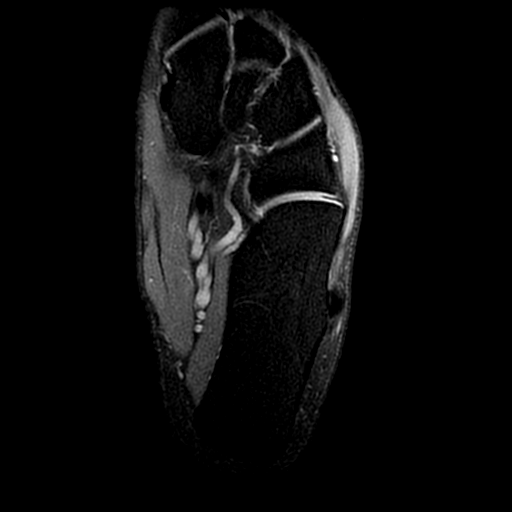
[im 20/40]
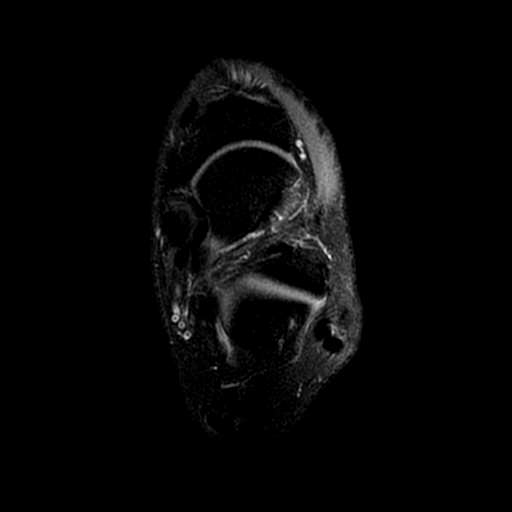
[im 25/40]
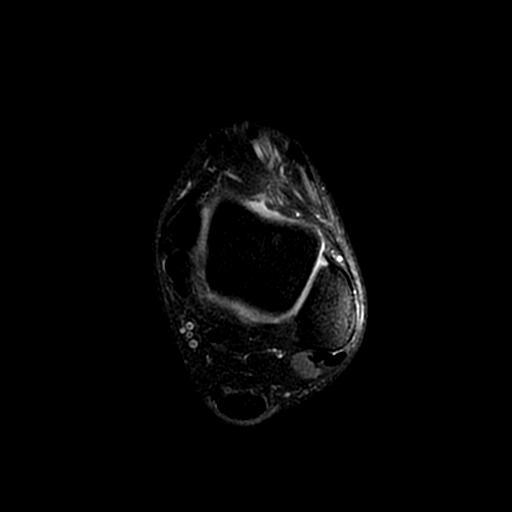
[im 30/40]
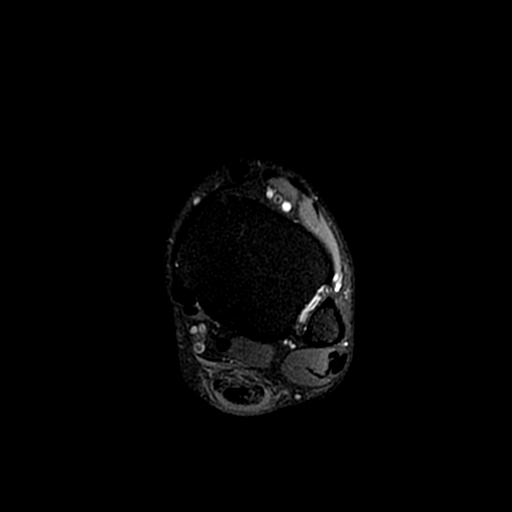
[im 35/40]
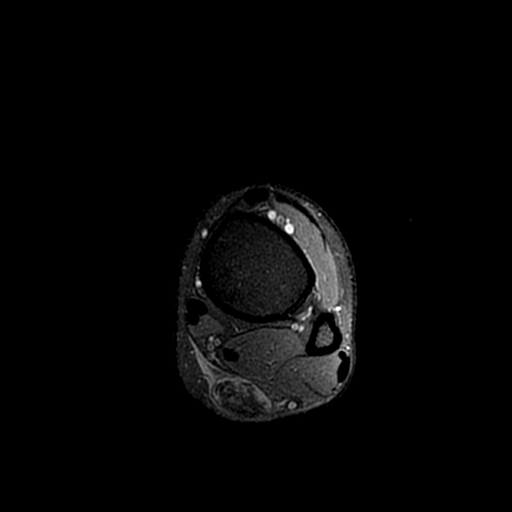
[im 40/40]
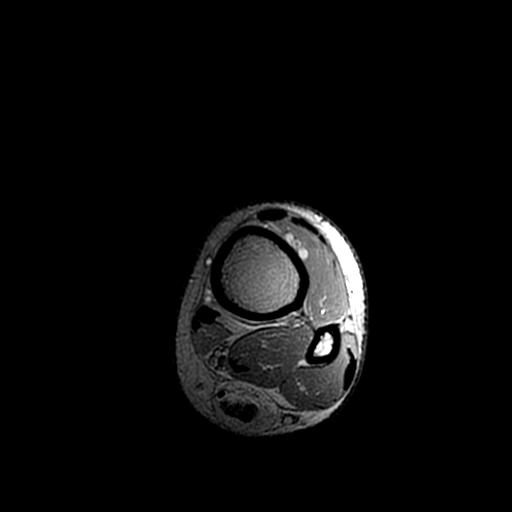

[Series 3: T1 · axial · 3.0mm · 0.31mm/px · z∈[-75,+61]mm · 5 of 40 slices shown]
[im 1/40]
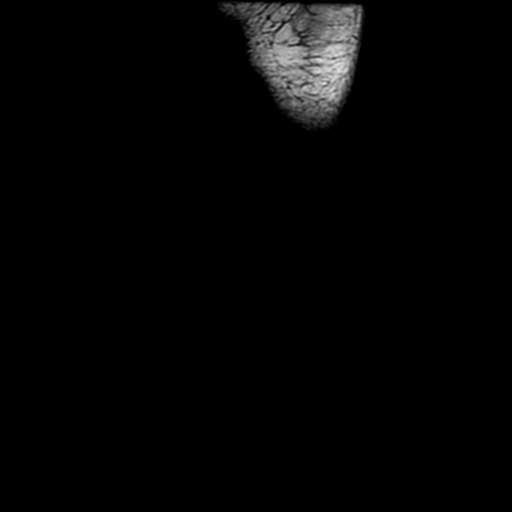
[im 5/40]
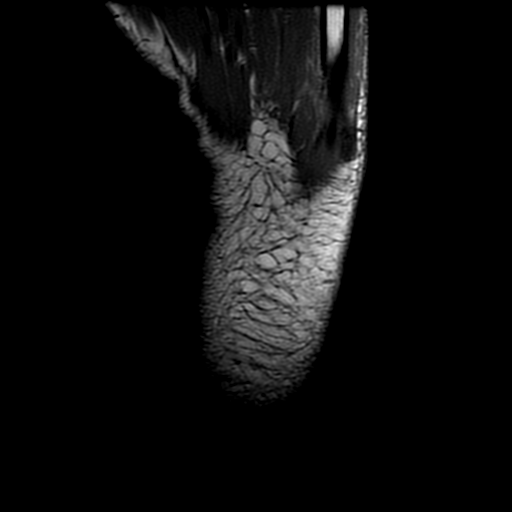
[im 10/40]
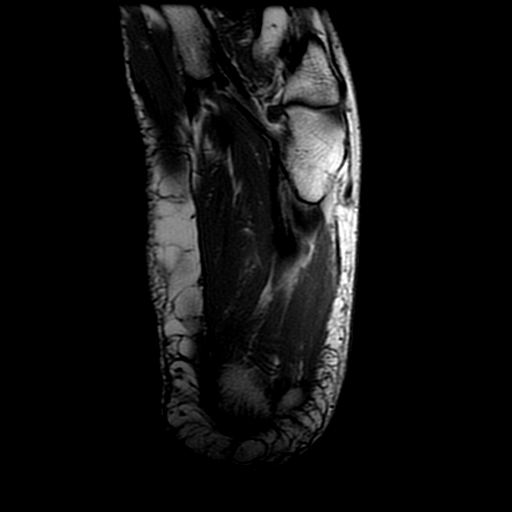
[im 20/40]
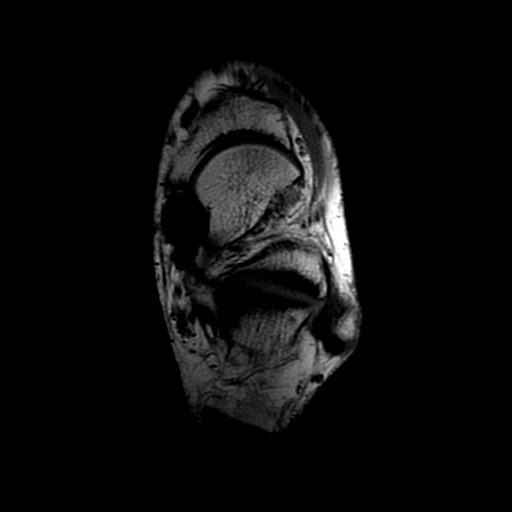
[im 35/40]
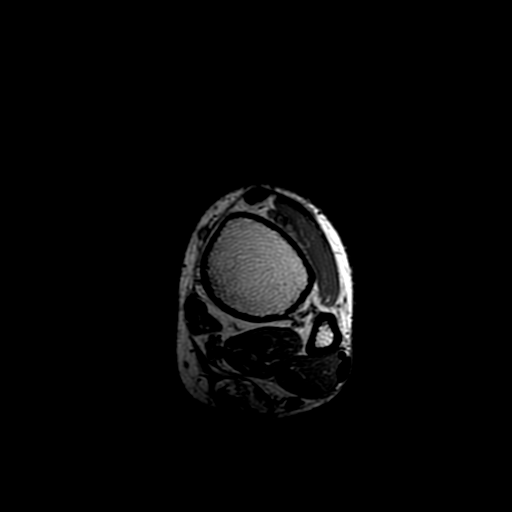

[Series 7: T2 fat-sat · coronal · 3.0mm · 0.66mm/px · 7 of 33 slices shown (1 of 2)]
[im 1/33]
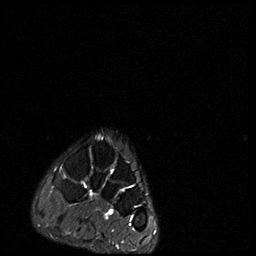
[im 6/33]
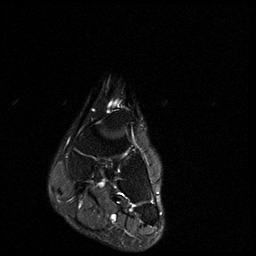
[im 11/33]
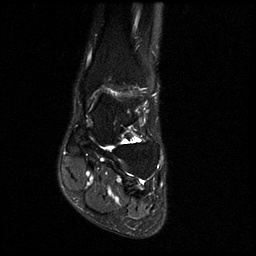
[im 17/33]
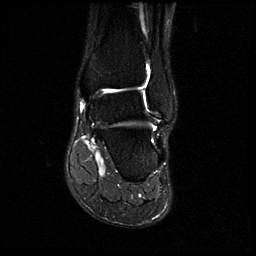
[im 22/33]
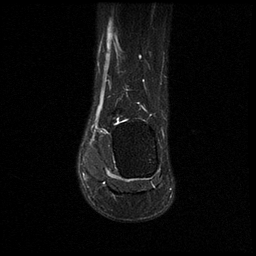
[im 27/33]
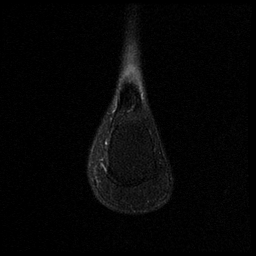
[im 33/33]
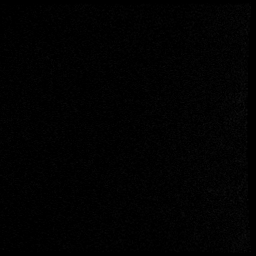

[Series 8: T2 fat-sat · axial · 3.0mm · 0.66mm/px · z∈[-80,+51]mm · 7 of 34 slices shown (2 of 2)]
[im 1/34]
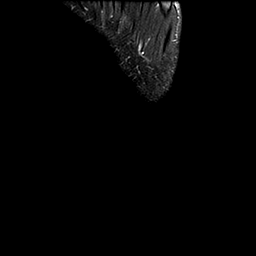
[im 6/34]
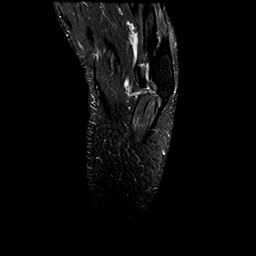
[im 12/34]
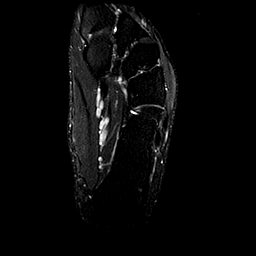
[im 17/34]
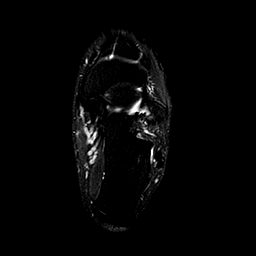
[im 23/34]
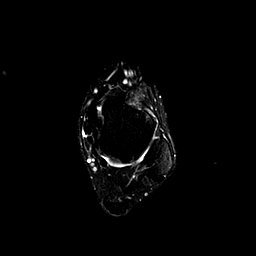
[im 28/34]
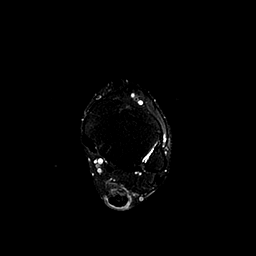
[im 34/34]
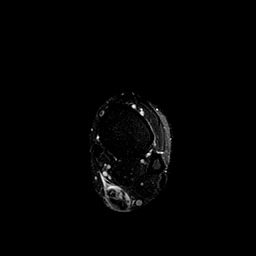

[28 of 40 positions shown; findings below may reference images not displayed]

FINDINGS: TENDONS

Peroneal: Peroneus longus and brevis tendons are intact and normally
positioned.

Posteromedial: Tibialis posterior, flexor hallucis longus, and
flexor digitorum longus tendons are intact and normally positioned.

Anterior: Tibialis anterior, extensor hallucis longus, and extensor
digitorum longus tendons are intact and normally positioned.

Achilles: High-grade complete or near complete tear of the Achilles
tendon at the level of the myotendinous junction with a small amount
of surrounding fluid and edema. No significant tendon retraction.
Distal insertion of the Achilles tendon is intact.

Plantar Fascia: Intact.

LIGAMENTS

Lateral: Intact tibiofibular ligaments. The anterior and posterior
talofibular ligaments are intact. Intact calcaneofibular ligament.

Medial: The deltoid and visualized portions of the spring ligament
appear intact.

CARTILAGE AND BONES

Ankle Joint: No significant ankle joint effusion. The talar dome and
tibial plafond are intact.

Subtalar Joints/Sinus Tarsi: No cartilage defect. No effusion.
Preservation of the anatomic fat within the sinus tarsi.

Bones: No acute fracture. No malalignment. No marrow signal
abnormality. No suspicious bone lesion.

Other: No significant soft tissue findings.
IMPRESSION: High-grade complete or near-complete tear of the Achilles tendon at
the level of the myotendinous junction.
